# Patient Record
Sex: Male | Born: 1950 | Race: White | Hispanic: No | Marital: Married | Smoking: Never smoker
Health system: Southern US, Community
[De-identification: ages and names within clinical notes are randomized; demographics above are authoritative.]

## PROBLEM LIST (undated history)

## (undated) DIAGNOSIS — N200 Calculus of kidney: Secondary | ICD-10-CM

## (undated) DIAGNOSIS — N529 Male erectile dysfunction, unspecified: Secondary | ICD-10-CM

## (undated) DIAGNOSIS — J309 Allergic rhinitis, unspecified: Secondary | ICD-10-CM

## (undated) DIAGNOSIS — I10 Essential (primary) hypertension: Secondary | ICD-10-CM

## (undated) DIAGNOSIS — M810 Age-related osteoporosis without current pathological fracture: Secondary | ICD-10-CM

## (undated) DIAGNOSIS — IMO0002 Reserved for concepts with insufficient information to code with codable children: Secondary | ICD-10-CM

## (undated) HISTORY — DX: Age-related osteoporosis without current pathological fracture: M81.0

---
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
## 2000-11-14 DIAGNOSIS — IMO0002 Reserved for concepts with insufficient information to code with codable children: Secondary | ICD-10-CM

## 2000-11-14 HISTORY — DX: Reserved for concepts with insufficient information to code with codable children: IMO0002

## 2000-11-14 HISTORY — PX: OTHER SURGICAL HISTORY: SHX169

## 2005-10-05 ENCOUNTER — Emergency Department: Payer: Self-pay | Admitting: Emergency Medicine

## 2010-01-31 ENCOUNTER — Ambulatory Visit: Payer: Self-pay | Admitting: Internal Medicine

## 2010-02-02 ENCOUNTER — Ambulatory Visit: Payer: Self-pay | Admitting: Internal Medicine

## 2010-12-05 ENCOUNTER — Observation Stay: Payer: Self-pay | Admitting: Internal Medicine

## 2013-10-10 ENCOUNTER — Emergency Department: Payer: Self-pay | Admitting: Emergency Medicine

## 2015-09-15 HISTORY — PX: CATARACT EXTRACTION: SUR2

## 2017-01-29 ENCOUNTER — Ambulatory Visit
Admission: EM | Admit: 2017-01-29 | Discharge: 2017-01-29 | Disposition: A | Discharge status: Home / Self Care | Payer: 59 | Attending: Family Medicine | Admitting: Family Medicine

## 2017-01-29 DIAGNOSIS — S61402A Unspecified open wound of left hand, initial encounter: Secondary | ICD-10-CM

## 2017-01-29 DIAGNOSIS — W5501XA Bitten by cat, initial encounter: Secondary | ICD-10-CM

## 2017-01-29 MED ORDER — AMOXICILLIN-POT CLAVULANATE 875-125 MG PO TABS: 1.0000 | ORAL_TABLET | Freq: Two times a day (BID) | ORAL | 0 refills | Status: DC

## 2017-01-29 NOTE — ED Provider Notes (Signed)
[facility]    CSN: [id number] Arrival date & time: 01/29/17  1514     History   Chief Complaint Chief Complaint  Patient presents with  . Animal Bite   HPI  66 year old male presents with complaints of a cat bite.  Patient states yesterday afternoon he brought home a new dog and one of his cats was unhappy and attempted to attack the dog. He intervened and got bit and was scratched on his left hand. The area has been red and swollen. Redness appears to be extending proximally. No fever. No other associated symptoms. Pain is mild in severity. No other complaints or concerns at this time.  Vaccines up-to-date on cat. His tetanus was in 2015.  PMH - History of kidney stones, allergic rhinitis  Past Surgical History:  Procedure Laterality Date  . HERNIA REPAIR  1964  Excision of skin cancer  Home Medications    Prior to Admission medications   Medication Sig Start Date End Date Taking? Authorizing Provider  amoxicillin-clavulanate (AUGMENTIN) 875-125 MG tablet Take 1 tablet by mouth every 12 (twelve) hours. 01/29/17   Coral Spikes, DO   Family History DM-2 - Mother and father  Social History Social History  Substance Use Topics  . Smoking status: Never Smoker  . Smokeless tobacco: Never Used  . Alcohol use No   Allergies   Sulfa antibiotics  Review of Systems Review of Systems  Skin: Positive for wound.  All other systems reviewed and are negative.  Physical Exam Triage Vital Signs ED Triage Vitals  Enc Vitals Group     BP 01/29/17 1531 (!) 160/93     Pulse Rate 01/29/17 1531 80     Resp 01/29/17 1531 18     Temp 01/29/17 1531 97.9 F (36.6 C)     Temp Source 01/29/17 1531 Oral     SpO2 01/29/17 1531 100 %     Weight 01/29/17 1529 275 lb (124.7 kg)     Height 01/29/17 1529 5\' 8"  (1.727 m)     Head Circumference --      Peak Flow --      Pain Score 01/29/17 1530 0     Pain Loc --      Pain Edu? --      Excl. in [place]? --    Updated Vital  Signs BP (!) 160/93 (BP Location: Right Arm)   Pulse 80   Temp 97.9 F (36.6 C) (Oral)   Resp 18   Ht 5\' 8"  (1.727 m)   Wt 275 lb (124.7 kg)   SpO2 100%   BMI 41.81 kg/m   Physical Exam  Constitutional: He is oriented to person, place, and time. He appears well-developed. No distress.  HENT:  Head: Normocephalic and atraumatic.  Eyes: Conjunctivae are normal.  Neck: Normal range of motion.  Cardiovascular: Normal rate and regular rhythm.   Pulmonary/Chest: Effort normal and breath sounds normal.  Abdominal: He exhibits no distension.  Musculoskeletal: Normal range of motion.  Neurological: He is alert and oriented to person, place, and time.  Skin:  Right hand - bite marks and scratches noted. Surrounding erythema which extends proximally to the mid to proximal forearm. Associated swelling.  Psychiatric: He has a normal mood and affect.  Vitals reviewed.  UC Treatments / Results  Labs (all labs ordered are listed, but only abnormal results are displayed) Labs Reviewed - No data to display  EKG  EKG Interpretation None  Radiology No results found.  Procedures Procedures (including critical care time)  Medications Ordered in UC Medications - No data to display   Initial Impression / Assessment and Plan / UC Course  I have reviewed the triage vital signs and the nursing notes.  Pertinent labs & imaging results that were available during my care of the patient were reviewed by me and considered in my medical decision making (see chart for details).   66 year old male presents with a cat bite. Tetanus up-to-date. Cats vaccines up-to-date. Animal control was called (Melissa). Treating with Augmentin.  Final Clinical Impressions(s) / UC Diagnoses   Final diagnoses:  Cat bite, initial encounter    New Prescriptions Discharge Medication List as of 01/29/2017  3:58 PM    START taking these medications   Details  amoxicillin-clavulanate (AUGMENTIN) 875-125  MG tablet Take 1 tablet by mouth every 12 (twelve) hours., Starting Sun 01/29/2017, Normal         Coral Spikes, DO 01/29/17 1620

## 2017-01-29 NOTE — ED Triage Notes (Signed)
Patient complains of scratches and cat bite on his left hand. Patient states that he brought home a new dog and the cat went crazy. Patient states that swelling and redness started this morning. Patient states that the bite occurred yesterday by his own cat and cat is UTD on vaccine.

## 2017-01-29 NOTE — Discharge Instructions (Signed)
Antibiotic as prescribed.  Take care  Dr. Halen Antenucci  

## 2017-03-01 ENCOUNTER — Encounter: Payer: Self-pay | Admitting: *Deleted

## 2017-03-01 ENCOUNTER — Ambulatory Visit
Admission: EM | Admit: 2017-03-01 | Discharge: 2017-03-01 | Disposition: A | Discharge status: Home / Self Care | Payer: 59 | Attending: Family Medicine | Admitting: Family Medicine

## 2017-03-01 DIAGNOSIS — S91112A Laceration without foreign body of left great toe without damage to nail, initial encounter: Secondary | ICD-10-CM | POA: Diagnosis not present

## 2017-03-01 MED ORDER — AMOXICILLIN-POT CLAVULANATE 875-125 MG PO TABS: 1.0000 | ORAL_TABLET | Freq: Two times a day (BID) | ORAL | 0 refills | Status: DC

## 2017-03-01 NOTE — ED Triage Notes (Signed)
Laceration to right big toe while walking in garden. Struck right big toe on broken off metal post. Small amount of bleeding continues.

## 2017-03-01 NOTE — Discharge Instructions (Signed)
Follow up in 8 days for suture removal or sooner if develop any problems

## 2017-03-01 NOTE — ED Provider Notes (Addendum)
[facility]    CSN: [id number] Arrival date & time: 03/01/17  1142     History   Chief Complaint Chief Complaint  Patient presents with  . Laceration    HPI Curtis Miles is a 66 y.o. male.   The history is provided by the patient.  Laceration  Location:  Toe Toe laceration location:  R great toe Length:  2cm Depth:  Cutaneous Quality: avulsion   Time since incident:  4 hours Laceration mechanism:  Metal edge Foreign body present:  No foreign bodies Relieved by:  None tried Worsened by:  Pressure Ineffective treatments:  None tried Tetanus status:  Up to date Associated symptoms: no fever, no focal weakness, no numbness, no rash, no redness, no swelling and no streaking     History reviewed. No pertinent past medical history.  There are no active problems to display for this patient.   Past Surgical History:  Procedure Laterality Date  . [place] Medications    Prior to Admission medications   Medication Sig Start Date End Date Taking? Authorizing Provider  amoxicillin-clavulanate (AUGMENTIN) 875-125 MG tablet Take 1 tablet by mouth 2 (two) times daily. 03/01/17   Norval Gable, MD    Family History History reviewed. No pertinent family history.  Social History Social History  Substance Use Topics  . Smoking status: Never Smoker  . Smokeless tobacco: Never Used  . Alcohol use No     Allergies   Sulfa antibiotics   Review of Systems Review of Systems  Constitutional: Negative for fever.  Skin: Negative for rash.  Neurological: Negative for focal weakness.     Physical Exam Triage Vital Signs ED Triage Vitals  Enc Vitals Group     BP 03/01/17 1221 (!) 171/95     Pulse Rate 03/01/17 1221 73     Resp 03/01/17 1221 16     Temp 03/01/17 1221 98 F (36.7 C)     Temp Source 03/01/17 1221 Oral     SpO2 03/01/17 1221 98 %     Weight 03/01/17 1223 275 lb (124.7 kg)     Height 03/01/17 1223 5\' 8"  (1.727  m)     Head Circumference --      Peak Flow --      Pain Score 03/01/17 1404 0     Pain Loc --      Pain Edu? --      Excl. in [place]? --    No data found.   Updated Vital Signs BP (!) 171/95 (BP Location: Left Arm)   Pulse 73   Temp 98 F (36.7 C) (Oral)   Resp 16   Ht 5\' 8"  (1.727 m)   Wt 275 lb (124.7 kg)   SpO2 98%   BMI 41.81 kg/m   Visual Acuity Right Eye Distance:   Left Eye Distance:   Bilateral Distance:    Right Eye Near:   Left Eye Near:    Bilateral Near:     Physical Exam  Constitutional: He appears well-developed and well-nourished. No distress.  Musculoskeletal:       Right foot: There is laceration (2cm V shaped superficial aceration to plantar aspect of right big toe; bleeding controlled with pressure; normal range of motion and strength; no foreign bodies; no bony tenderness). There is normal range of motion, no bony tenderness, no swelling, normal capillary refill, no crepitus and no deformity.  Feet:  Skin: He is not diaphoretic.  Nursing note and vitals reviewed.    UC Treatments / Results  Labs (all labs ordered are listed, but only abnormal results are displayed) Labs Reviewed - No data to display  EKG  EKG Interpretation None       Radiology No results found.  Procedures .Marland KitchenLaceration Repair Date/Time: 03/01/2017 4:24 PM Performed by: Norval Gable Authorized by: Norval Gable   Consent:    Consent obtained:  Verbal   Consent given by:  Patient   Risks discussed:  Infection, pain, poor cosmetic result, tendon damage, vascular damage, retained foreign body, need for additional repair and nerve damage   Alternatives discussed:  No treatment Anesthesia (see MAR for exact dosages):    Anesthesia method:  Local infiltration   Local anesthetic:  Lidocaine 1% w/o epi Laceration details:    Location:  Toe   Toe location:  R big toe   Length (cm):  2 Repair type:    Repair type:  Simple Pre-procedure details:     Preparation:  Patient was prepped and draped in usual sterile fashion Exploration:    Hemostasis achieved with:  Direct pressure   Wound exploration: wound explored through full range of motion and entire depth of wound probed and visualized     Wound extent: no fascia violation noted, no foreign bodies/material noted, no muscle damage noted, no nerve damage noted, no tendon damage noted, no underlying fracture noted and no vascular damage noted     Contaminated: no   Treatment:    Area cleansed with:  Hibiclens   Amount of cleaning:  Standard   Irrigation solution:  Sterile saline   Irrigation method:  Pressure wash Skin repair:    Repair method:  Sutures   Suture size:  5-0   Suture material:  Nylon   Suture technique:  Simple interrupted   Number of sutures:  5 Approximation:    Approximation:  Close Post-procedure details:    Dressing:  Antibiotic ointment and adhesive bandage   Patient tolerance of procedure:  Tolerated well, no immediate complications    (including critical care time)  Medications Ordered in UC Medications - No data to display   Initial Impression / Assessment and Plan / UC Course  I have reviewed the triage vital signs and the nursing notes.  Pertinent labs & imaging results that were available during my care of the patient were reviewed by me and considered in my medical decision making (see chart for details).       Final Clinical Impressions(s) / UC Diagnoses   Final diagnoses:  Laceration of left great toe without damage to nail, foreign body presence unspecified, initial encounter    New Prescriptions Discharge Medication List as of 03/01/2017  1:40 PM     1 diagnosis reviewed with patient 2. rx as per orders above; reviewed possible side effects, interactions, risks and benefits; rx for augmentin (prophylactic/empiric) due to mechanism  3. Recommend supportive treatment with wound care (verbal and written information given) 4. Follow-up  in 7-8 days for suture removal or sooner  prn if symptoms worsen or don't improve   Norval Gable, MD 03/01/17 [place], MD 03/01/17 [phone]

## 2017-03-09 ENCOUNTER — Ambulatory Visit
Admission: EM | Admit: 2017-03-09 | Discharge: 2017-03-09 | Disposition: A | Discharge status: Home / Self Care | Payer: 59

## 2017-03-09 DIAGNOSIS — Z4802 Encounter for removal of sutures: Secondary | ICD-10-CM

## 2017-03-09 NOTE — ED Triage Notes (Signed)
Patient arrived for suture removal. Laceration well healed with no drainage redness or soreness. 5 sutures removed.

## 2017-12-05 ENCOUNTER — Encounter: Payer: Self-pay | Admitting: *Deleted

## 2017-12-06 ENCOUNTER — Ambulatory Visit: Payer: Medicare Other | Admitting: Anesthesiology

## 2017-12-06 ENCOUNTER — Ambulatory Visit
Admission: RE | Admit: 2017-12-06 | Discharge: 2017-12-06 | Disposition: A | Discharge status: Home / Self Care | Payer: Medicare Other | Source: Ambulatory Visit | Attending: Internal Medicine | Admitting: Internal Medicine

## 2017-12-06 ENCOUNTER — Encounter
Admission: RE | Disposition: A | Discharge status: Home / Self Care | Payer: Self-pay | Source: Ambulatory Visit | Attending: Internal Medicine

## 2017-12-06 ENCOUNTER — Encounter: Payer: Self-pay | Admitting: *Deleted

## 2017-12-06 DIAGNOSIS — K5730 Diverticulosis of large intestine without perforation or abscess without bleeding: Secondary | ICD-10-CM | POA: Diagnosis not present

## 2017-12-06 DIAGNOSIS — D125 Benign neoplasm of sigmoid colon: Secondary | ICD-10-CM | POA: Diagnosis not present

## 2017-12-06 DIAGNOSIS — Z1211 Encounter for screening for malignant neoplasm of colon: Secondary | ICD-10-CM | POA: Insufficient documentation

## 2017-12-06 DIAGNOSIS — Z85828 Personal history of other malignant neoplasm of skin: Secondary | ICD-10-CM | POA: Insufficient documentation

## 2017-12-06 DIAGNOSIS — J309 Allergic rhinitis, unspecified: Secondary | ICD-10-CM | POA: Insufficient documentation

## 2017-12-06 DIAGNOSIS — Z8371 Family history of colonic polyps: Secondary | ICD-10-CM | POA: Diagnosis not present

## 2017-12-06 DIAGNOSIS — Z79899 Other long term (current) drug therapy: Secondary | ICD-10-CM | POA: Diagnosis not present

## 2017-12-06 DIAGNOSIS — K640 First degree hemorrhoids: Secondary | ICD-10-CM | POA: Insufficient documentation

## 2017-12-06 DIAGNOSIS — Z87442 Personal history of urinary calculi: Secondary | ICD-10-CM | POA: Insufficient documentation

## 2017-12-06 DIAGNOSIS — D128 Benign neoplasm of rectum: Secondary | ICD-10-CM | POA: Insufficient documentation

## 2017-12-06 DIAGNOSIS — I10 Essential (primary) hypertension: Secondary | ICD-10-CM | POA: Diagnosis not present

## 2017-12-06 HISTORY — DX: Calculus of kidney: N20.0

## 2017-12-06 HISTORY — PX: COLONOSCOPY WITH PROPOFOL: SHX5780

## 2017-12-06 HISTORY — DX: Essential (primary) hypertension: I10

## 2017-12-06 HISTORY — DX: Allergic rhinitis, unspecified: J30.9

## 2017-12-06 HISTORY — DX: Male erectile dysfunction, unspecified: N52.9

## 2017-12-06 HISTORY — DX: Reserved for concepts with insufficient information to code with codable children: IMO0002

## 2017-12-06 SURGERY — COLONOSCOPY WITH PROPOFOL
Anesthesia: General

## 2017-12-06 MED ORDER — FENTANYL CITRATE (PF) 100 MCG/2ML IJ SOLN
INTRAMUSCULAR | Status: AC
  Filled 2017-12-06: qty 2

## 2017-12-06 MED ORDER — PROPOFOL 10 MG/ML IV BOLUS
INTRAVENOUS | Status: DC | PRN
  Administered 2017-12-06: 100 mg via INTRAVENOUS

## 2017-12-06 MED ORDER — SODIUM CHLORIDE 0.9 % IV SOLN
INTRAVENOUS | Status: DC
  Administered 2017-12-06 (×2): via INTRAVENOUS

## 2017-12-06 MED ORDER — PHENYLEPHRINE HCL 10 MG/ML IJ SOLN
INTRAMUSCULAR | Status: DC | PRN
  Administered 2017-12-06 (×2): 100 ug via INTRAVENOUS

## 2017-12-06 MED ORDER — FENTANYL CITRATE (PF) 100 MCG/2ML IJ SOLN
INTRAMUSCULAR | Status: DC | PRN
  Administered 2017-12-06 (×2): 50 ug via INTRAVENOUS

## 2017-12-06 MED ORDER — PROPOFOL 500 MG/50ML IV EMUL
INTRAVENOUS | Status: AC
  Filled 2017-12-06: qty 50

## 2017-12-06 MED ORDER — PROPOFOL 500 MG/50ML IV EMUL
INTRAVENOUS | Status: DC | PRN
  Administered 2017-12-06: 140 ug/kg/min via INTRAVENOUS

## 2017-12-06 MED ORDER — LIDOCAINE HCL (PF) 2 % IJ SOLN
INTRAMUSCULAR | Status: AC
  Filled 2017-12-06: qty 10

## 2017-12-06 MED ORDER — LIDOCAINE 2% (20 MG/ML) 5 ML SYRINGE
INTRAMUSCULAR | Status: DC | PRN
  Administered 2017-12-06: 40 mg via INTRAVENOUS

## 2017-12-06 NOTE — Anesthesia Post-op Follow-up Note (Signed)
Anesthesia QCDR form completed.        

## 2017-12-06 NOTE — Transfer of Care (Signed)
Immediate Anesthesia Transfer of Care Note  Patient: Curtis Miles  Procedure(s) Performed: COLONOSCOPY WITH PROPOFOL (N/A )  Patient Location: PACU and Endoscopy Unit  Anesthesia Type:General  Level of Consciousness: sedated  Airway & Oxygen Therapy: Patient Spontanous Breathing and Patient connected to nasal cannula oxygen  Post-op Assessment: Report given to RN and Post -op Vital signs reviewed and stable  Post vital signs: Reviewed and stable  Last Vitals:  Vitals:   12/06/17 0853 12/06/17 1030  BP: 111/75   Pulse: 81   Resp: 16   Temp: (!) 36 C (!) 36 C  SpO2: 96%     Last Pain:  Vitals:   12/06/17 1030  TempSrc: Tympanic         Complications: No apparent anesthesia complications

## 2017-12-06 NOTE — Interval H&P Note (Signed)
History and Physical Interval Note:  12/06/2017 10:00 AM  Curtis Miles  has presented today for surgery, with the diagnosis of FAMILY HX.OF COLON POLYPS  The various methods of treatment have been discussed with the patient and family. After consideration of risks, benefits and other options for treatment, the patient has consented to  Procedure(s): COLONOSCOPY WITH PROPOFOL (N/A) as a surgical intervention .  The patient's history has been reviewed, patient examined, no change in status, stable for surgery.  I have reviewed the patient's chart and labs.  Questions were answered to the patient's satisfaction.     [place], [place]

## 2017-12-06 NOTE — H&P (Signed)
Outpatient short stay form Pre-procedure 12/06/2017 9:59 AM Curtis Miles K. Alice Reichert, M.D.  Primary Physician: Mancel Bale, M.D.  Reason for visit:  Family history of colon polyps in a first-degree relative  History of present illness:  Patient is a 67 year old male without current GI symptoms presenting for elevated risk colon cancer screening given family history of colon polyps. Patient denies any abdominal pain, rectal bleeding or change in bowel habits.    Current Facility-Administered Medications:  .  0.9 %  sodium chloride infusion, , Intravenous, Continuous, [place], Benay Pike, MD, Last Rate: 20 mL/hr at 12/06/17 7106  Medications Prior to Admission  Medication Sig Dispense Refill Last Dose  . Cholecalciferol (VITAMIN D3) 2000 units TABS Take 1 tablet by mouth daily.    Past Week at Unknown time  . lisinopril-hydrochlorothiazide (PRINZIDE,ZESTORETIC) 10-12.5 MG tablet Take 1 tablet by mouth daily.   12/05/2017 at Unknown time  . loratadine (CLARITIN) 10 MG tablet Take 10 mg by mouth daily as needed for allergies.   Past Week at Unknown time  . Multiple Vitamin (MULTIVITAMIN) tablet Take 1 tablet by mouth daily.   Past Week at Unknown time  . psyllium (METAMUCIL) 58.6 % powder Take 1 packet by mouth daily. Take 3.4 gram/12 gram oral powder daily with full glass (225mL) of fluid     . sildenafil (REVATIO) 20 MG tablet Take 20 mg by mouth as needed (Take 2-5 tablets (40-100mg  total) as directed).        Allergies  Allergen Reactions  . Sulfa Antibiotics      Past Medical History:  Diagnosis Date  . Allergic rhinitis   . ED (erectile dysfunction)   . Hypertension   . Kidney stones   . 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 The patient understands the nature of the planned procedure, indications, risks, alternatives and potential complications including but not limited to bleeding, infection, perforation, damage to internal organs and possible oversedation/side effects from anesthesia. The patient agrees and gives consent to proceed.  Please refer to procedure notes for findings, recommendations and patient disposition/instructions.    Curtis Miles K. Alice Reichert, M.D. Gastroenterology 12/06/2017  9:59 AM

## 2017-12-06 NOTE — Anesthesia Postprocedure Evaluation (Signed)
Anesthesia Post Note  Patient: Curtis Miles  Procedure(s) Performed: COLONOSCOPY WITH PROPOFOL (N/A )  Patient location during evaluation: Endoscopy Anesthesia Type: General Level of consciousness: awake and alert and oriented Pain management: pain level controlled Vital Signs Assessment: post-procedure vital signs reviewed and stable Respiratory status: spontaneous breathing, nonlabored ventilation and respiratory function stable Cardiovascular status: blood pressure returned to baseline and stable Postop Assessment: no signs of nausea or vomiting Anesthetic complications: no     Last Vitals:  Vitals:   12/06/17 1100 12/06/17 1104  BP:  115/81  Pulse: 64 62  Resp: 19 17  Temp:    SpO2: 98% 100%    Last Pain:  Vitals:   12/06/17 1030  TempSrc: Tympanic                 Cornesha Radziewicz

## 2017-12-06 NOTE — Op Note (Signed)
[facility] Gastroenterology Patient Name: Curtis Miles Procedure Date: 12/06/2017 10:00 AM MRN: [id number] Account #: [id number] Date of Birth: Jan 20, 1951 Admit Type: Outpatient Age: 67 Room: [facility] ENDO ROOM 3 Gender: Male Note Status: Finalized Procedure:            Colonoscopy Indications:          Colon cancer screening in patient at increased risk:                        Family history of 1st-degree relative with colon polyps Providers:            Benay Pike. Alice Reichert MD, MD Referring MD:         Christena Flake. Raechel Ache, MD (Referring MD) Medicines:            Propofol per Anesthesia Complications:        No immediate complications. Procedure:            Pre-Anesthesia Assessment:                       - The risks and benefits of the procedure and the                        sedation options and risks were discussed with the                        patient. All questions were answered and informed                        consent was obtained.                       - Patient identification and proposed procedure were                        verified prior to the procedure by the nurse. The                        procedure was verified in the pre-procedure area in the                        procedure room.                       - ASA Grade Assessment: II - A patient with mild                        systemic disease.                       - After reviewing the risks and benefits, the patient                        was deemed in satisfactory condition to undergo the                        procedure.                       After obtaining informed consent, the colonoscope was  passed under direct vision. Throughout the procedure,                        the patient's blood pressure, pulse, and oxygen                        saturations were monitored continuously. The                        Colonoscope was introduced through the anus and    advanced to the the cecum, identified by appendiceal                        orifice and ileocecal valve. The colonoscopy was                        performed without difficulty. The patient tolerated the                        procedure well. The quality of the bowel preparation                        was good. The terminal ileum, ileocecal valve,                        appendiceal orifice, and rectum were photographed. Findings:      The perianal and digital rectal examinations were normal. Pertinent       negatives include normal sphincter tone and no palpable rectal lesions.      Many small-mouthed diverticula were found in the sigmoid colon.      Two sessile polyps were found in the rectum and distal sigmoid colon.       The polyps were 6 to 8 mm in size. These polyps were removed with a hot       snare. Resection and retrieval were complete.      Non-bleeding internal hemorrhoids were found during retroflexion. The       hemorrhoids were Grade I (internal hemorrhoids that do not prolapse).      The exam was otherwise without abnormality. Impression:           - Diverticulosis in the sigmoid colon.                       - Two 6 to 8 mm polyps in the rectum and in the distal                        sigmoid colon, removed with a hot snare. Resected and                        retrieved.                       - Non-bleeding internal hemorrhoids.                       - The examination was otherwise normal. Recommendation:       - Patient has a contact number available for                        emergencies. The signs and symptoms of potential  delayed complications were discussed with the patient.                        Return to normal activities tomorrow. Written discharge                        instructions were provided to the patient.                       - Resume previous diet.                       - Continue present medications.                       -  Await pathology results.                       - Repeat colonoscopy date to be determined after                        pending pathology results are reviewed for surveillance                        based on pathology results.                       - The findings and recommendations were discussed with                        the patient and their spouse.                       - Return to GI office PRN. Procedure Code(s):    --- Professional ---                       [phone], Colonoscopy, flexible; with removal of tumor(s),                        polyp(s), or other lesion(s) by snare technique Diagnosis Code(s):    --- Professional ---                       K57.30, Diverticulosis of large intestine without                        perforation or abscess without bleeding                       D12.5, Benign neoplasm of sigmoid colon                       K62.1, Rectal polyp                       K64.0, First degree hemorrhoids                       Z83.71, Family history of colonic polyps CPT copyright 2016 American [employer]. All rights reserved. The codes documented in this report are preliminary and upon coder review may  be revised to meet current compliance requirements. Efrain Sella MD, MD 12/06/2017 10:33:21 AM This report has been signed electronically.  Number of Addenda: 0 Note Initiated On: 12/06/2017 10:00 AM Scope Withdrawal Time: 0 hours 14 minutes 39 seconds  Total Procedure Duration: 0 hours 18 minutes 13 seconds       [facility]

## 2017-12-06 NOTE — Anesthesia Preprocedure Evaluation (Signed)
Anesthesia Evaluation  Patient identified by MRN, date of birth, ID band Patient awake    Reviewed: Allergy & Precautions, NPO status , Patient's Chart, lab work & pertinent test results  History of Anesthesia Complications Negative for: history of anesthetic complications  Airway Mallampati: III  TM Distance: >3 FB Neck ROM: Full    Dental no notable dental hx.    Pulmonary neg pulmonary ROS, neg sleep apnea, neg COPD,    breath sounds clear to auscultation- rhonchi (-) wheezing      Cardiovascular Exercise Tolerance: Good hypertension, Pt. on medications (-) CAD, (-) Past MI, (-) Cardiac Stents and (-) CABG  Rhythm:Regular Rate:Normal - Systolic murmurs and - Diastolic murmurs    Neuro/Psych negative neurological ROS  negative psych ROS   GI/Hepatic negative GI ROS, Neg liver ROS,   Endo/Other  negative endocrine ROSneg diabetes  Renal/GU Renal disease: hx of nephrolithiasis.     Musculoskeletal negative musculoskeletal ROS (+)   Abdominal (+) + obese,   Peds  Hematology negative hematology ROS (+)   Anesthesia Other Findings Past Medical History: No date: Allergic rhinitis No date: ED (erectile dysfunction) No date: Hypertension No date: Kidney stones 2002: Squamous cell carcinoma     Comment:  Primary squamous cell carcinoma of chest wall   Reproductive/Obstetrics                             Anesthesia Physical Anesthesia Plan  ASA: II  Anesthesia Plan: General   Post-op Pain Management:    Induction: Intravenous  PONV Risk Score and Plan: 1 and Propofol infusion  Airway Management Planned: Natural Airway  Additional Equipment:   Intra-op Plan:   Post-operative Plan:   Informed Consent: I have reviewed the patients History and Physical, chart, labs and discussed the procedure including the risks, benefits and alternatives for the proposed anesthesia with the  patient or authorized representative who has indicated his/her understanding and acceptance.   Dental advisory given  Plan Discussed with: CRNA and Anesthesiologist  Anesthesia Plan Comments:         Anesthesia Quick Evaluation

## 2017-12-07 ENCOUNTER — Encounter: Payer: Self-pay | Admitting: Internal Medicine

## 2017-12-07 LAB — SURGICAL PATHOLOGY

## 2018-01-23 ENCOUNTER — Encounter: Payer: Self-pay | Admitting: Dietician

## 2018-01-23 ENCOUNTER — Encounter: Payer: Medicare Other | Attending: Internal Medicine | Admitting: Dietician

## 2018-01-23 VITALS — BP 120/82 | Ht 67.5 in | Wt 272.4 lb

## 2018-01-23 DIAGNOSIS — Z6841 Body Mass Index (BMI) 40.0 and over, adult: Secondary | ICD-10-CM | POA: Diagnosis not present

## 2018-01-23 DIAGNOSIS — E119 Type 2 diabetes mellitus without complications: Secondary | ICD-10-CM | POA: Insufficient documentation

## 2018-01-23 DIAGNOSIS — Z713 Dietary counseling and surveillance: Secondary | ICD-10-CM | POA: Diagnosis not present

## 2018-01-23 NOTE — Progress Notes (Signed)
Diabetes Self-Management Education  Visit Type: First/Initial  Appt. Start Time: 0900 Appt. End Time: 1000  01/23/2018  [name], identified by name and date of birth, is a 67 y.o. male with a diagnosis of Diabetes: Type 2.   ASSESSMENT  Blood pressure 120/82, height 5' 7.5" (1.715 m), weight 272 lb 6.4 oz (123.6 kg). Body mass index is 42.03 kg/m.  Diabetes Self-Management Education - 01/23/18 1015      Visit Information   Visit Type  First/Initial      Initial Visit   Diabetes Type  Type 2      Health Coping   How would you rate your overall health?  Good      Psychosocial Assessment   Patient Belief/Attitude about Diabetes  Motivated to manage diabetes    Self-care barriers  None    Self-management support  Doctor's office;Family    Other persons present  Patient    Patient Concerns  Weight Control;Glycemic Control;Medication;Healthy Lifestyle become more fit    Special Needs  None    Preferred Learning Style  Visual    Learning Readiness  Ready    What is the last grade level you completed in school?  graduate      Pre-Education Assessment   Patient understands incorporating nutritional management into lifestyle.  Needs Instruction    Patient undertands incorporating physical activity into lifestyle.  Needs Instruction    Patient understands using medications safely.  Needs Instruction    Patient understands monitoring blood glucose, interpreting and using results  Needs Instruction    Patient understands prevention, detection, and treatment of acute complications.  Needs Instruction    Patient understands prevention, detection, and treatment of chronic complications.  Needs Instruction    Patient understands how to develop strategies to address psychosocial issues.  Needs Instruction    Patient understands how to develop strategies to promote health/change behavior.  Needs Instruction      Complications   Last HgB A1C per patient/outside source  6.6 %  01-05-18    How often do you check your blood sugar?  0 times/day (not testing)    Have you had a dilated eye exam in the past 12 months?  No 10-2016    Have you had a dental exam in the past 12 months?  Yes 11-2017    Are you checking your feet?  No -c/o mild numbness in thighs with prolonged standing     Dietary Intake   Breakfast  eats breakfast at 7a    Snack (morning)  eats occasional am snack of crackers and peanut butter or 1/2 peanut butter sandwich    Lunch  eats lunch 12-2p    Snack (afternoon)  occasionally eats 1/2 peanut butter sandwich or crackers and peanut butter    Dinner  eats supper at 6p-9p    Snack (evening)  none    Beverage(s)  drinks water 2-3x/day, unsweet tea and coffee and diet sodas 4-5x/day + occasional  buttermilk      Exercise   Exercise Type  Moderate (swimming / aerobic walking) recumbent bike and ski machine    How many days per week to you exercise?  6    How many minutes per day do you exercise?  25    Total minutes per week of exercise  150      Patient Education   Previous Diabetes Education  No    Disease state   Factors that contribute to the development of diabetes;Definition of  diabetes, type 1 and 2, and the diagnosis of diabetes    Nutrition management   Role of diet in the treatment of diabetes and the relationship between the three main macronutrients and blood glucose level;Food label reading, portion sizes and measuring food.;Carbohydrate counting    Physical activity and exercise   Role of exercise on diabetes management, blood pressure control and cardiac health.    Medications  Reviewed patients medication for diabetes, action, purpose, timing of dose and side effects.    Monitoring  Taught/evaluated SMBG meter.;Taught/discussed recording of test results and interpretation of SMBG.;Identified appropriate SMBG and/or A1C goals.;Purpose and frequency of SMBG.;Yearly dilated eye exam gave pt Ultra One Touch Verio Flex meter and instructed on  its use-BG 133 (2hr pp-ate egg, cheese and sausage burrito)    Chronic complications  Relationship between chronic complications and blood glucose control;Dental care;Retinopathy and reason for yearly dilated eye exams;Reviewed with patient heart disease, higher risk of, and prevention;Lipid levels, blood glucose control and heart disease;Nephropathy, what it is, prevention of, the use of ACE, ARB's and early detection of through urine microalbumia.    Personal strategies to promote health  Lifestyle issues that need to be addressed for better diabetes care;Helped patient develop diabetes management plan for (enter comment)      Outcomes   Expected Outcomes  Demonstrated interest in learning. Expect positive outcomes       Individualized Plan for Diabetes Self-Management Training:   Learning Objective:  Patient will have a greater understanding of diabetes self-management. Patient education plan is to attend individual and/or group sessions per assessed needs and concerns.   Plan:   Patient Instructions    Check blood sugars 2 x day before breakfast and 2 hrs after supper every day Bring blood sugar records to the next appointment/class Call your doctor for a prescription for:  1. Meter strips (type): One Touch Verio test strips  checking  2 times per day  2. Lancets (type): One Touch Delica lancets  checking  2   times per day Exercise: continue recumbent bike and ski machine for   30  minutes   6  days a week Eat 3 meals day and  1  snack a day at bedtime Space meals 4-6 hours apart Eat 3 carbohydrate servings/meal + protein Eat 1 carbohydrate serving/snack + protein Avoid sugar sweetened drinks (soda, tea, coffee, sports drinks, juices) Drink plenty of water Limit intake of fried foods and sweets Make an eye doctor appointment Get a Sharps container Return for appointment/classes on:  02-08-18   Expected Outcomes:  Demonstrated interest in learning. Expect positive  outcomes  Education material provided: General meal planning guidelines, Food Group handout, One Touch Verio Flex meter  If problems or questions, patient to contact team via:  [phone]  Future DSME appointment:  02-08-18

## 2018-01-23 NOTE — Patient Instructions (Addendum)
   Check blood sugars 2 x day before breakfast and 2 hrs after supper every day Bring blood sugar records to the next appointment/class Call your doctor for a prescription for:  1. Meter strips (type): One Touch Verio test strips  checking  2 times per day  2. Lancets (type): One Touch Delica lancets  checking  2   times per day Exercise: continue recumbent bike and ski machine for   30  minutes   6  days a week Eat 3 meals day and  1  snack a day at bedtime Space meals 4-6 hours apart Eat 3 carbohydrate servings/meal + protein Eat 1 carbohydrate serving/snack + protein Avoid sugar sweetened drinks (soda, tea, coffee, sports drinks, juices) Drink plenty of water Limit intake of fried foods and sweets Make an eye doctor appointment Get a Sharps container Return for appointment/classes on:  02-08-18

## 2018-02-08 ENCOUNTER — Encounter: Payer: Self-pay | Admitting: Dietician

## 2018-02-08 ENCOUNTER — Encounter: Payer: Medicare Other | Admitting: Dietician

## 2018-02-08 VITALS — Ht 67.5 in | Wt 268.7 lb

## 2018-02-08 DIAGNOSIS — Z713 Dietary counseling and surveillance: Secondary | ICD-10-CM | POA: Diagnosis not present

## 2018-02-08 DIAGNOSIS — E119 Type 2 diabetes mellitus without complications: Secondary | ICD-10-CM

## 2018-02-08 NOTE — Progress Notes (Signed)

## 2018-02-15 ENCOUNTER — Encounter: Payer: Self-pay | Admitting: *Deleted

## 2018-02-15 ENCOUNTER — Encounter: Payer: Medicare Other | Attending: Internal Medicine | Admitting: *Deleted

## 2018-02-15 VITALS — Wt 268.4 lb

## 2018-02-15 DIAGNOSIS — E119 Type 2 diabetes mellitus without complications: Secondary | ICD-10-CM | POA: Diagnosis not present

## 2018-02-15 DIAGNOSIS — Z6841 Body Mass Index (BMI) 40.0 and over, adult: Secondary | ICD-10-CM | POA: Diagnosis not present

## 2018-02-15 DIAGNOSIS — Z713 Dietary counseling and surveillance: Secondary | ICD-10-CM | POA: Insufficient documentation

## 2018-02-15 NOTE — Progress Notes (Signed)

## 2018-02-22 ENCOUNTER — Encounter: Payer: Medicare Other | Admitting: Dietician

## 2018-02-22 ENCOUNTER — Encounter: Payer: Self-pay | Admitting: Dietician

## 2018-02-22 VITALS — BP 118/80 | Ht 67.5 in | Wt 266.7 lb

## 2018-02-22 DIAGNOSIS — E119 Type 2 diabetes mellitus without complications: Secondary | ICD-10-CM

## 2018-02-22 DIAGNOSIS — Z713 Dietary counseling and surveillance: Secondary | ICD-10-CM | POA: Diagnosis not present

## 2018-02-22 NOTE — Progress Notes (Signed)

## 2018-02-23 ENCOUNTER — Other Ambulatory Visit: Payer: Self-pay | Admitting: "Endocrinology

## 2018-02-23 DIAGNOSIS — E291 Testicular hypofunction: Secondary | ICD-10-CM

## 2018-02-26 ENCOUNTER — Encounter: Payer: Self-pay | Admitting: Dietician

## 2018-03-02 ENCOUNTER — Ambulatory Visit
Admission: RE | Admit: 2018-03-02 | Discharge: 2018-03-02 | Disposition: A | Discharge status: Home / Self Care | Payer: Medicare Other | Source: Ambulatory Visit | Attending: "Endocrinology | Admitting: "Endocrinology

## 2018-03-02 DIAGNOSIS — I739 Peripheral vascular disease, unspecified: Secondary | ICD-10-CM | POA: Diagnosis not present

## 2018-03-02 DIAGNOSIS — E291 Testicular hypofunction: Secondary | ICD-10-CM

## 2018-03-02 MED ORDER — GADOBENATE DIMEGLUMINE 529 MG/ML IV SOLN
10.0000 mL | Freq: Once | INTRAVENOUS | Status: AC | PRN
  Administered 2018-03-02: 10 mL via INTRAVENOUS

## 2018-10-29 ENCOUNTER — Encounter: Payer: Self-pay | Admitting: Dietician

## 2018-12-07 IMAGING — MR MR HEAD WO/W CM
19 series · 48 of 48 positions shown · IV contrast (10 ML MULTIHANCE)
Comparison: None.

CLINICAL DATA: Low testosterone.  Male hypogonadism.

EXAM:
MRI HEAD WITHOUT AND WITH CONTRAST
TECHNIQUE: Multiplanar, multiecho pulse sequences of the brain and surrounding
structures were obtained without and with intravenous contrast.
CONTRAST:  10mL MULTIHANCE GADOBENATE DIMEGLUMINE 529 MG/ML IV SOLN

[Series 2: T1 · sagittal · 5.0mm · 0.45mm/px · 1 of 25 slices shown (1 of 5)]
[im 1/25]
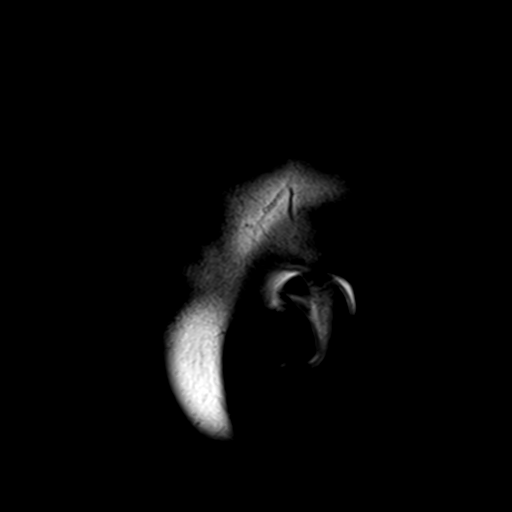

[Series 4: DWI · axial · 3.0mm · 1.20mm/px · z∈[-69,+93]mm · 5 of 55 slices shown (1 of 2)]
[im 1/55]
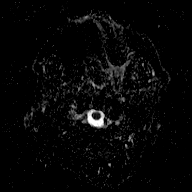
[im 14/55]
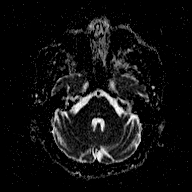
[im 28/55]
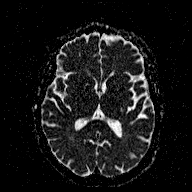
[im 41/55]
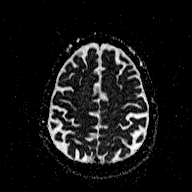
[im 55/55]
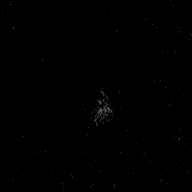

[Series 5: T2 · axial · 5.0mm · 0.90mm/px · z∈[-73,+95]mm · 3 of 29 slices shown (1 of 2)]
[im 1/29]
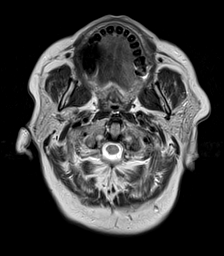
[im 15/29]
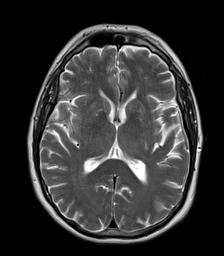
[im 29/29]
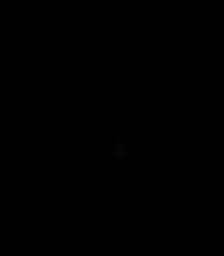

[Series 6: FLAIR · axial · 3.0mm · 0.45mm/px · z∈[-64,+87]mm · 4 of 43 slices shown]
[im 1/43]
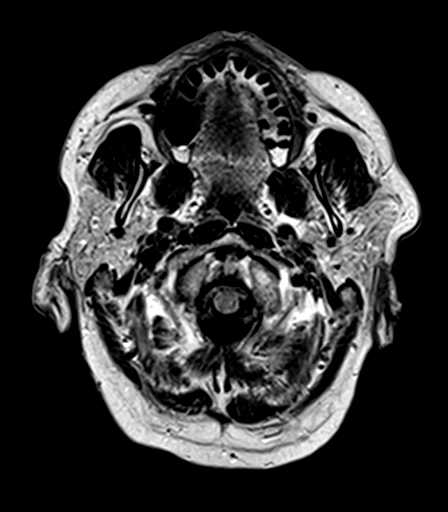
[im 15/43]
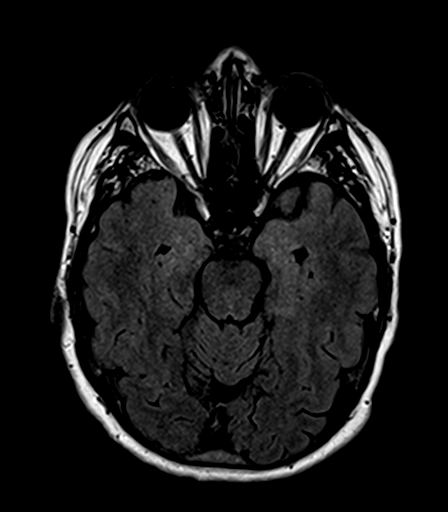
[im 29/43]
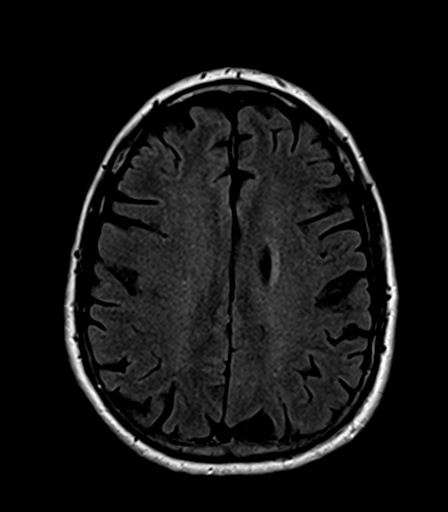
[im 43/43]
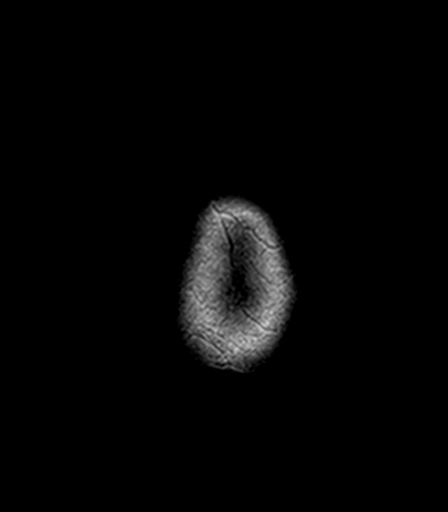

[Series 7: T2 · axial · 5.0mm · 0.72mm/px · z∈[-67,+89]mm · 2 of 27 slices shown (2 of 2)]
[im 1/27]
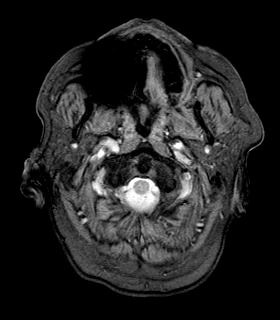
[im 27/27]
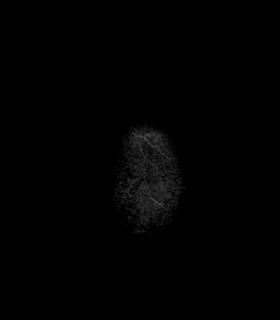

[Series 8: T1 · sagittal · 3.0mm · 0.53mm/px · 1 of 15 slices shown (2 of 5)]
[im 1/15]
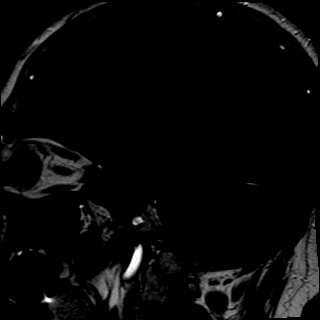

[Series 9: T1 · coronal · 3.0mm · 0.53mm/px · 1 of 15 slices shown (3 of 5)]
[im 1/15]
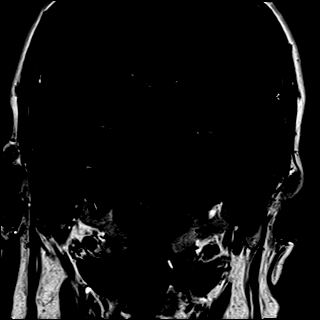

[Series 10: T1 · coronal · non-contrast · 3.0mm · 0.42mm/px · 1 of 9 slices shown (4 of 5)]
[im 1/9]
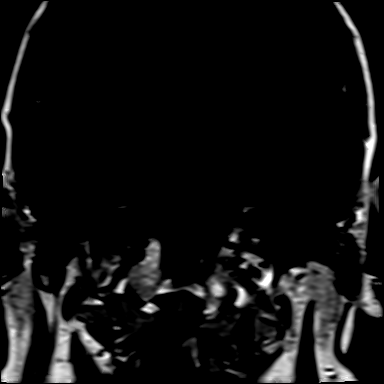

[Series 11: T1 post-contrast · coronal · 3.0mm · 0.62mm/px · 1 of 9 slices shown (1 of 9)]
[im 1/9]
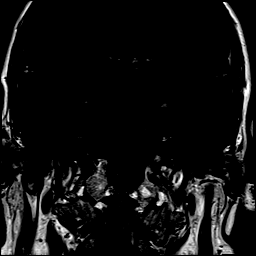

[Series 12: T1 post-contrast · coronal · 3.0mm · 0.62mm/px · 1 of 9 slices shown (2 of 9)]
[im 1/9]
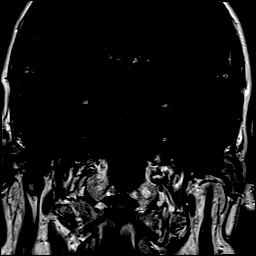

[Series 13: T1 post-contrast · coronal · 3.0mm · 0.62mm/px · 1 of 9 slices shown (3 of 9)]
[im 1/9]
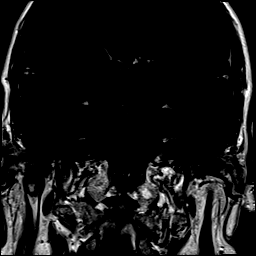

[Series 14: T1 post-contrast · coronal · 3.0mm · 0.62mm/px · 1 of 9 slices shown (4 of 9)]
[im 1/9]
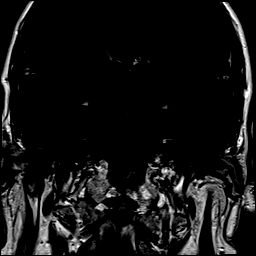

[Series 15: T1 post-contrast · coronal · 3.0mm · 0.62mm/px · 1 of 9 slices shown (5 of 9)]
[im 1/9]
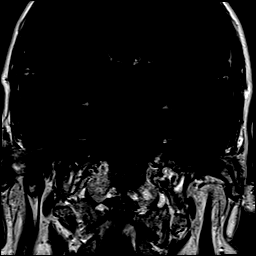

[Series 16: T1 post-contrast · coronal · 3.0mm · 0.62mm/px · 1 of 9 slices shown (6 of 9)]
[im 1/9]
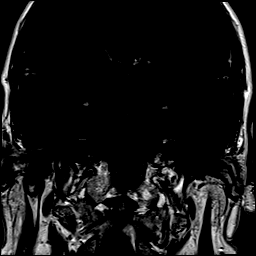

[Series 17: T1 post-contrast · sagittal · 3.0mm · 0.53mm/px · 1 of 15 slices shown (7 of 9)]
[im 1/15]
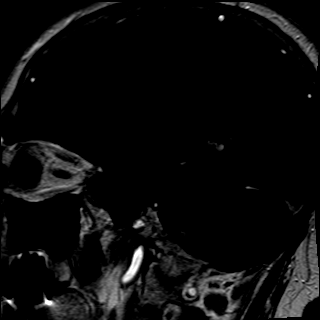

[Series 18: T1 post-contrast · coronal · 3.0mm · 0.44mm/px · 1 of 15 slices shown (8 of 9)]
[im 1/15]
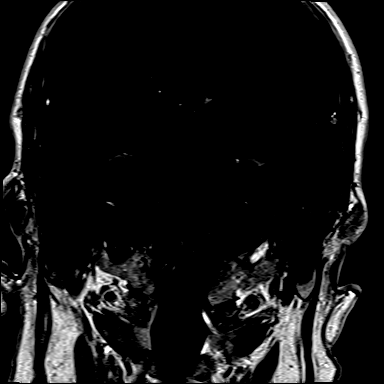

[Series 19: T1 · axial · 1.0mm · 1.00mm/px · z∈[-68,+91]mm · 14 of 160 slices shown (5 of 5)]
[im 1/160]
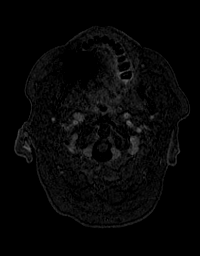
[im 13/160]
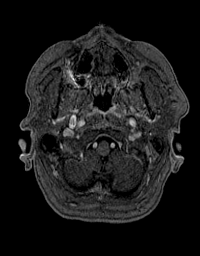
[im 25/160]
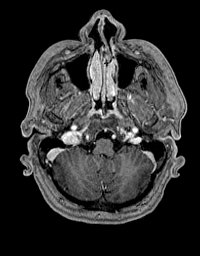
[im 37/160]
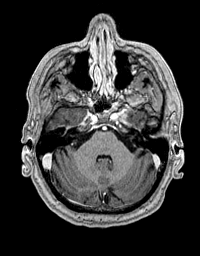
[im 49/160]
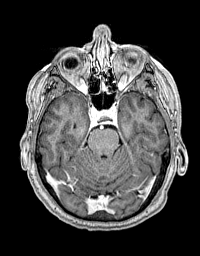
[im 62/160]
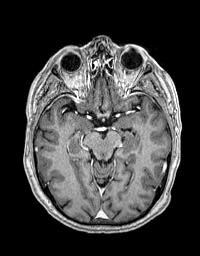
[im 74/160]
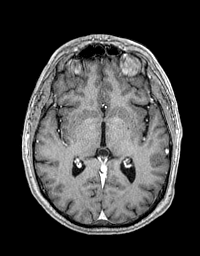
[im 86/160]
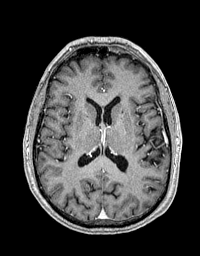
[im 98/160]
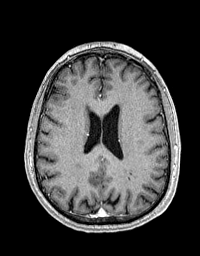
[im 111/160]
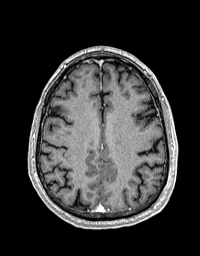
[im 123/160]
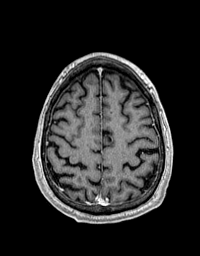
[im 135/160]
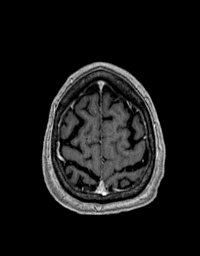
[im 147/160]
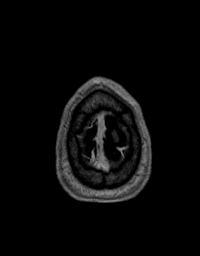
[im 160/160]
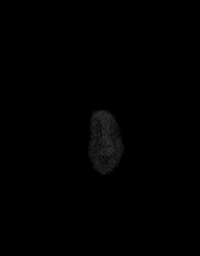

[Series 20: T1 post-contrast · coronal · 5.0mm · 0.45mm/px · 3 of 31 slices shown (9 of 9)]
[im 1/31]
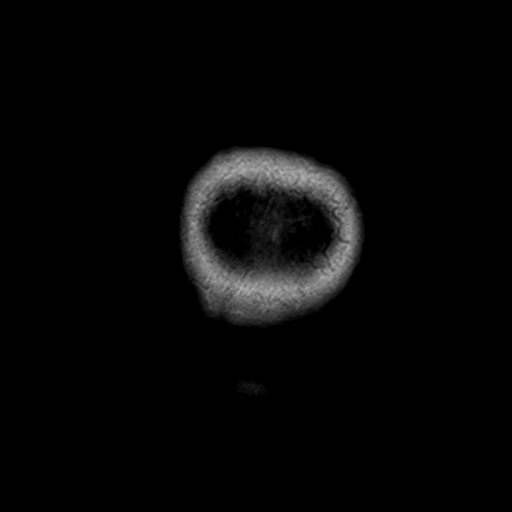
[im 16/31]
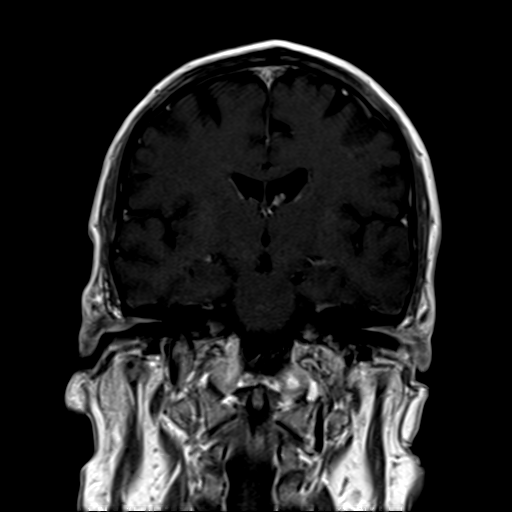
[im 31/31]
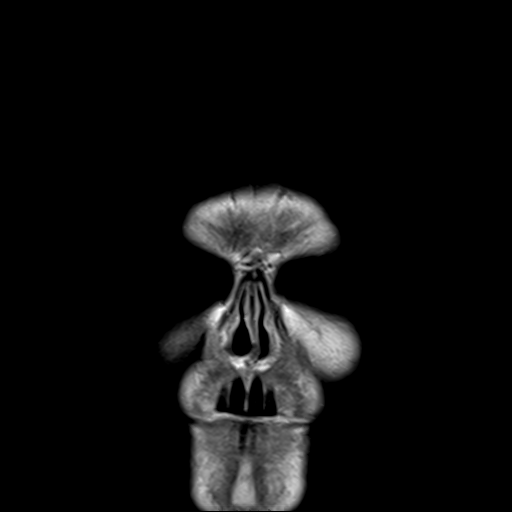

[Series 100: DWI · axial · 3.0mm · 1.20mm/px · z∈[-69,+93]mm · 5 of 55 slices shown (2 of 2)]
[im 1/55]
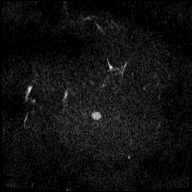
[im 14/55]
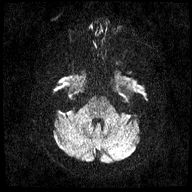
[im 28/55]
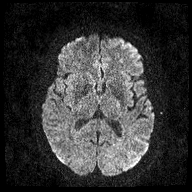
[im 41/55]
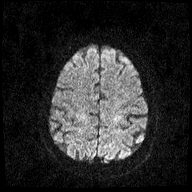
[im 55/55]
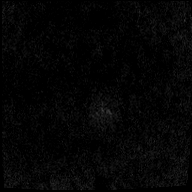

[48 of 48 positions shown; findings below may reference images not displayed]

FINDINGS: Brain: No evidence for acute infarction, hemorrhage, mass lesion,
hydrocephalus, or extra-axial fluid. Normal for age cerebral volume.
Mild subcortical and periventricular T2 and FLAIR hyperintensities,
likely chronic microvascular ischemic change.

Post infusion, no abnormal enhancement of the brain or meninges.

Thin-section imaging through the pituitary was performed before,
during, and after contrast bolus infusion. Normal gland height.
Stalk midline. Intact posterior pituitary bright spot. No areas of
differential enhancement to suggest microadenoma or macroadenoma. No
parasellar or suprasellar mass lesion.

Vascular: Normal flow voids.

Skull and upper cervical spine: Normal marrow signal.

Sinuses/Orbits: Negative.  BILATERAL cataract extraction.

Other: None.
IMPRESSION: MRI of the brain demonstrates normal for age cerebral volume with
mild small vessel disease. No acute intracranial findings.

Targeted examination to the pituitary reveals no evidence for
micro/macroadenoma,no parasellar/suprasellar mass, nor areas of
abnormal intracranial enhancement.

## 2023-10-24 ENCOUNTER — Ambulatory Visit
Admission: RE | Admit: 2023-10-24 | Discharge: 2023-10-24 | Disposition: A | Discharge status: Home / Self Care | Payer: Medicare Other | Source: Ambulatory Visit | Attending: Orthopedic Surgery | Admitting: Orthopedic Surgery

## 2023-10-24 ENCOUNTER — Other Ambulatory Visit: Payer: Self-pay | Admitting: Orthopedic Surgery

## 2023-10-24 DIAGNOSIS — I82461 Acute embolism and thrombosis of right calf muscular vein: Secondary | ICD-10-CM | POA: Insufficient documentation
